# Patient Record
Sex: Male | Born: 2000 | Race: White | Hispanic: No | Marital: Single | Smoking: Never smoker
Health system: Southern US, Community
[De-identification: ages and names within clinical notes are randomized; demographics above are authoritative.]

## PROBLEM LIST (undated history)

## (undated) DIAGNOSIS — J45909 Unspecified asthma, uncomplicated: Secondary | ICD-10-CM

## (undated) DIAGNOSIS — T7840XA Allergy, unspecified, initial encounter: Secondary | ICD-10-CM

## (undated) HISTORY — PX: TYMPANOSTOMY TUBE PLACEMENT: SHX32

## (undated) HISTORY — DX: Allergy, unspecified, initial encounter: T78.40XA

## (undated) HISTORY — DX: Unspecified asthma, uncomplicated: J45.909

## (undated) HISTORY — PX: TONSILLECTOMY AND ADENOIDECTOMY: SHX28

---
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
## 2021-01-15 ENCOUNTER — Telehealth: Payer: Self-pay | Admitting: Family Medicine

## 2021-01-15 NOTE — Telephone Encounter (Signed)
Patient needs physical by 8/5 explained to grandmother [name] Fusi) that we had no appointment until 8/10 that she can go to urgent care in [place] they can do sports physical at any time. Please advise

## 2021-01-15 NOTE — Telephone Encounter (Signed)
I am willing to do August 1 Monday at 1120 that is our only potential opening we could offer

## 2021-01-18 NOTE — Telephone Encounter (Signed)
Nurse please close error °

## 2021-02-03 ENCOUNTER — Encounter: Payer: Managed Care, Other (non HMO) | Admitting: Family Medicine

## 2021-02-08 ENCOUNTER — Telehealth: Payer: Self-pay

## 2021-02-08 ENCOUNTER — Ambulatory Visit (INDEPENDENT_AMBULATORY_CARE_PROVIDER_SITE_OTHER): Payer: Managed Care, Other (non HMO) | Admitting: Family Medicine

## 2021-02-08 ENCOUNTER — Other Ambulatory Visit: Payer: Self-pay

## 2021-02-08 VITALS — BP 112/72 | HR 90 | Temp 97.5°F | Ht 69.02 in | Wt 160.0 lb

## 2021-02-08 DIAGNOSIS — Z0000 Encounter for general adult medical examination without abnormal findings: Secondary | ICD-10-CM

## 2021-02-08 LAB — POCT URINALYSIS DIP (MANUAL ENTRY)
Bilirubin, UA: NEGATIVE
Blood, UA: NEGATIVE
Glucose, UA: NEGATIVE mg/dL
Ketones, POC UA: NEGATIVE mg/dL
Leukocytes, UA: NEGATIVE
Nitrite, UA: NEGATIVE
Protein Ur, POC: NEGATIVE mg/dL
Spec Grav, UA: 1.01 (ref 1.010–1.025)
Urobilinogen, UA: 0.2 E.U./dL
pH, UA: 7 (ref 5.0–8.0)

## 2021-02-08 NOTE — Telephone Encounter (Signed)
Tried calling patient due to completed physical form . Voicemail not available.

## 2021-02-08 NOTE — Addendum Note (Signed)
Addended by: Alm Bustard R on: 02/08/2021 02:36 PM   Modules accepted: Orders

## 2021-02-08 NOTE — Progress Notes (Signed)
   Subjective:    Patient ID: Soledad Gerlach, male    DOB: Nov 28, 2000, 20 y.o.   MRN: [id number]  HPI  The patient comes in today for a wellness visit.    A review of their health history was completed.  A review of medications was also completed.  Any needed refills; no  Eating habits: healthy, well balanced  Falls/  MVA accidents in past few months: no  Regular exercise: run, active lifestyle  Specialist pt sees on regular basis: none  Preventative health issues were discussed.   Additional concerns: none  Review of Systems     Objective:   Physical Exam General-in no acute distress Eyes-no discharge Lungs-respiratory rate normal, CTA CV-no murmurs,RRR Extremities skin warm dry no edema Neuro grossly normal Behavior normal, alert  Orthopedic normal No murmurs with standing or squatting or standing afterwards GU normal Does not smoke does not drink denies being depressed denies drug use      Assessment & Plan:  Adult wellness-complete.wellness physical was conducted today. Importance of diet and exercise were discussed in detail.  In addition to this a discussion regarding safety was also covered. We also reviewed over immunizations and gave recommendations regarding current immunization needed for age.  In addition to this additional areas were also touched on including: Preventative health exams needed:  Colonoscopy not indicated  Patient was advised yearly wellness exam

## 2021-05-10 ENCOUNTER — Telehealth: Payer: Self-pay | Admitting: Family Medicine

## 2021-05-10 DIAGNOSIS — M79643 Pain in unspecified hand: Secondary | ICD-10-CM

## 2021-05-10 NOTE — Telephone Encounter (Signed)
Mom states pt is in college at [employer] and will need a specialist there. Placed that in referral note along with mom name and number.

## 2021-05-10 NOTE — Telephone Encounter (Signed)
Mother called and stated that Bradley Boyd was having issues with his hand per football coach. She took him to an orthopedic and he recommended a hand specialist but needed a referral from his PCP. Please advise.   CB#  [phone]

## 2021-05-10 NOTE — Telephone Encounter (Signed)
May have referral Dr. Merrilee Seashore group has hand specialist (But there are other hand specialist beyond Dr. Merlyn Lot)

## 2021-05-10 NOTE — Telephone Encounter (Signed)
Please advise. Thank you

## 2021-05-10 NOTE — Addendum Note (Signed)
Addended by: Marlowe Shores on: 05/10/2021 04:01 PM   Modules accepted: Orders

## 2021-05-14 NOTE — Telephone Encounter (Signed)
Grandmother called in reference to this referral. I have advised grandmother that the referral has been sent on 05/13/2021 to [facility]. She has requested a copy of the referral placed up front so her husband Desma Paganini can pick up. I have printed and placed up front for pick up.

## 2021-06-30 ENCOUNTER — Telehealth: Payer: Self-pay | Admitting: Family Medicine

## 2021-06-30 NOTE — Telephone Encounter (Signed)
Mom calling on patient stating he is very with drawn from people for months now and is going to bed in the middle of the day. She is deeply concern about his mental state. He is in from college on break. Please advise where to add to your schedule

## 2021-07-01 NOTE — Telephone Encounter (Signed)
May have an 1120 or 1140 visit on Tuesday or Wednesday of next week Also if young man is depressed in regards to thinking about hurting himself then highly recommended behavioral health Hospital for outpatient emergency assessment. Otherwise we could work him into the schedule next week as per above

## 2021-07-01 NOTE — Telephone Encounter (Signed)
Patient is scheduled for appt with Dr Lorin Picket 07/06/21 at 11:20am- Advised to go to [facility] for Emergency assessment if starts having thoughts of harming self.

## 2021-07-06 ENCOUNTER — Encounter: Payer: Self-pay | Admitting: Family Medicine

## 2021-07-06 ENCOUNTER — Other Ambulatory Visit: Payer: Self-pay

## 2021-07-06 ENCOUNTER — Ambulatory Visit: Payer: Managed Care, Other (non HMO) | Admitting: Family Medicine

## 2021-07-06 VITALS — BP 122/62 | HR 72 | Temp 98.8°F | Ht 69.06 in | Wt 168.0 lb

## 2021-07-06 DIAGNOSIS — F411 Generalized anxiety disorder: Secondary | ICD-10-CM | POA: Diagnosis not present
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
## 2021-12-16 ENCOUNTER — Ambulatory Visit (INDEPENDENT_AMBULATORY_CARE_PROVIDER_SITE_OTHER): Payer: Managed Care, Other (non HMO) | Admitting: Family Medicine

## 2021-12-16 ENCOUNTER — Encounter: Payer: Self-pay | Admitting: Family Medicine

## 2021-12-16 VITALS — BP 116/72 | Temp 98.2°F | Wt 167.2 lb

## 2021-12-16 DIAGNOSIS — F411 Generalized anxiety disorder: Secondary | ICD-10-CM
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
## 2021-12-16 NOTE — Patient Instructions (Signed)
[name]  We will go ahead and work on the referral to counselor  Please let me know your thoughts regarding if you need a letter for an emotional support dog  We are here if you need [place] sooner  TakeCare-Dr. Lorin Picket

## 2022-02-09 ENCOUNTER — Ambulatory Visit: Payer: Managed Care, Other (non HMO) | Admitting: Family Medicine

## 2022-02-09 ENCOUNTER — Encounter: Payer: Self-pay | Admitting: Family Medicine

## 2022-02-09 VITALS — BP 108/70 | HR 65 | Ht 69.0 in | Wt 167.4 lb

## 2022-02-09 DIAGNOSIS — R5383 Other fatigue: Secondary | ICD-10-CM | POA: Diagnosis not present

## 2022-02-09 DIAGNOSIS — Z13220 Encounter for screening for lipoid disorders: Secondary | ICD-10-CM

## 2022-02-09 DIAGNOSIS — Z0000 Encounter for general adult medical examination without abnormal findings: Secondary | ICD-10-CM

## 2022-02-09 DIAGNOSIS — F411 Generalized anxiety disorder: Secondary | ICD-10-CM

## 2022-02-09 NOTE — Progress Notes (Unsigned)
Subjective:    Patient ID: Bradley Boyd, male    DOB: September 30, 2000, 21 y.o.   MRN: [id number]  HPI Pt arrives today for ESA paper to be complete. Pt also state his here for physical. Pt also needing requesting lab work. Pt states he spoke with Dr.Azarel Banner about ESA form at last visit.  Patient has underlying generalized anxiety disorder he was seen back in December 2022 he was also seen in June 2023 we did discuss general measures as well as coping mechanisms as well as considering to see counseling.  He is now open to seeing counseling and will help set this up within his school Patient denies being depressed but does find himself feeling down finds himself at times feeling like a failure is not suicidal.  He has had this condition for greater than 6 months He has excessive anxiousness and worry It is very difficult for him to control and he finds himself unable to control it It is associated with restlessness, difficulty concentrating, irritability, feeling excessively restless in his muscles He does not use marijuana or other drugs He has no other contributing issues It does affect him both at school as well as at home He has tried serotonin reuptake inhibitors but it did not help I have encouraged him to do counseling several times he is now open to this and will seek counseling through his college Finds himself anxious nervous on edge frequently.  When he is around his dog he states that it comforts him to have the dog close by where he could pet the dog and interact with the dog this helps him greatly with his anxiety.  Review of Systems     Objective:   Physical Exam General-in no acute distress Eyes-no discharge Lungs-respiratory rate normal, CTA CV-no murmurs,RRR Extremities skin warm dry no edema Neuro grossly normal Behavior normal, alert  GU normal      Assessment & Plan:  1. Well adult exam Adult wellness-complete.wellness physical was conducted today. Importance of  diet and exercise were discussed in detail.  Importance of stress reduction and healthy living were discussed.  In addition to this a discussion regarding safety was also covered.  We also reviewed over immunizations and gave recommendations regarding current immunization needed for age.   In addition to this additional areas were also touched on including: Preventative health exams needed:  Colonoscopy not indicated  Patient was advised yearly wellness exam  - CBC with Differential - Comprehensive metabolic panel - Ferritin - Lipid panel - Testosterone  2. Other fatigue Mild fatigue continue healthy eating regular activity check lab work patient interested in seeing if he is iron deficient or possibility of low testosterone - CBC with Differential - Comprehensive metabolic panel - Ferritin - Lipid panel - Testosterone  3. Screening, lipid Screening - CBC with Differential - Comprehensive metabolic panel - Ferritin - Lipid panel - Testosterone  4. GAD (generalized anxiety disorder) Patient has had this for years but progressively worse over the past few years I have encouraged him to do counseling on a regular basis he does not want to be on any medications.  I encouraged him to start counseling at his college when he goes back  He gets anxious when he is by himself and when he is out in public a support animal would be good for him he states he has a dog that helps him with his anxiety It is my opinion that the emotional support animal would help lessen his  degree of anxiousness and allow him to focus more on school as well as interact with other students and his family better. I also recommend counseling through school behavioral health With the patient's severe anxiety it would cause problems with succeeding in school and interacting with others therefore emotional support animal would be beneficial.  He has been counseled regarding some behavioral techniques that he  can use to try to help alleviate some of his anxiousness.  He has been given some handouts regarding this as well.

## 2022-02-10 ENCOUNTER — Telehealth: Payer: Self-pay | Admitting: Family Medicine

## 2022-02-10 ENCOUNTER — Encounter: Payer: Self-pay | Admitting: Family Medicine

## 2022-02-10 NOTE — Telephone Encounter (Signed)
Forms from his college was filled out. A letter was dictated This letter needs to be printed and I will need to sign it on Monday We will also need to provide a copy of office visit from 07/06/2021, 12/16/2021, 02/09/2022 to go along with this written form.  You may notify the patient that he can pick these forms up on Monday afternoon.  Thanks-Dr. Lorin Picket

## 2022-02-10 NOTE — Progress Notes (Signed)
This letter needs to be signed by me on Monday please

## 2022-02-11 NOTE — Telephone Encounter (Signed)
Upfront for pick up .Tried calling patient on 8/4 but no answer.

## 2023-06-28 ENCOUNTER — Ambulatory Visit: Payer: Managed Care, Other (non HMO) | Admitting: Family Medicine

## 2023-06-28 VITALS — BP 120/70 | HR 66 | Temp 98.5°F | Ht 69.0 in | Wt 178.6 lb

## 2023-06-28 DIAGNOSIS — Z13220 Encounter for screening for lipoid disorders: Secondary | ICD-10-CM | POA: Diagnosis not present

## 2023-06-28 DIAGNOSIS — E349 Endocrine disorder, unspecified: Secondary | ICD-10-CM

## 2023-06-28 DIAGNOSIS — R5383 Other fatigue: Secondary | ICD-10-CM

## 2023-06-28 DIAGNOSIS — Z0000 Encounter for general adult medical examination without abnormal findings: Secondary | ICD-10-CM

## 2023-06-28 DIAGNOSIS — Z0001 Encounter for general adult medical examination with abnormal findings: Secondary | ICD-10-CM

## 2023-06-28 NOTE — Progress Notes (Signed)
   Subjective:    Patient ID: Bradley Boyd, male    DOB: 05-Feb-2001, 22 y.o.   MRN: [id number]  HPI The patient comes in today for a wellness visit.  Patient does not smoke or drink Tries to eat healthy At times patient states he feels like his testosterone might be low he states at times he has low energy.  He works out 6 times a week tries to eat healthy  A review of their health history was completed.  A review of medications was also completed.  Any needed refills; No  Eating habits: Fluctuate   Falls/  MVA accidents in past few months: No  Regular exercise: Yes  Specialist pt sees on regular basis: No  Preventative health issues were discussed.   Additional concerns: Questions regarding possible hormone imbalance  Discussed the use of AI scribe software for clinical note transcription with the patient, who gave verbal consent to proceed.   Review of Systems     Objective:   Physical Exam  General-in no acute distress Eyes-no discharge Lungs-respiratory rate normal, CTA CV-no murmurs,RRR Extremities skin warm dry no edema Neuro grossly normal Behavior normal, alert       Assessment & Plan:  1. Well adult exam (Primary) Adult wellness-complete.wellness physical was conducted today. Importance of diet and exercise were discussed in detail.  Importance of stress reduction and healthy living were discussed.  In addition to this a discussion regarding safety was also covered.  We also reviewed over immunizations and gave recommendations regarding current immunization needed for age.   In addition to this additional areas were also touched on including: Preventative health exams needed:  Colonoscopy no need for colonoscopy currently  Patient was advised yearly wellness exam   2. Screening, lipid Screening  3. Testosterone deficiency Check testosterone level await results  4. Other fatigue Lab work ordered

## 2023-07-03 ENCOUNTER — Other Ambulatory Visit: Payer: Self-pay | Admitting: *Deleted

## 2023-07-03 DIAGNOSIS — Z13220 Encounter for screening for lipoid disorders: Secondary | ICD-10-CM

## 2023-07-03 DIAGNOSIS — R5383 Other fatigue: Secondary | ICD-10-CM

## 2023-07-03 DIAGNOSIS — E349 Endocrine disorder, unspecified: Secondary | ICD-10-CM

## 2023-12-27 ENCOUNTER — Ambulatory Visit: Payer: Managed Care, Other (non HMO) | Admitting: Family Medicine
